# Patient Record
Sex: Female | Born: 2005 | Hispanic: No | Marital: Single | Smoking: Never smoker
Health system: Southern US, Community
[De-identification: ages and names within clinical notes are randomized; demographics above are authoritative.]

---
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
## 2024-01-09 ENCOUNTER — Encounter: Payer: Self-pay | Admitting: *Deleted

## 2024-02-18 DIAGNOSIS — N760 Acute vaginitis: Secondary | ICD-10-CM | POA: Diagnosis not present

## 2024-02-18 DIAGNOSIS — B3731 Acute candidiasis of vulva and vagina: Secondary | ICD-10-CM | POA: Diagnosis not present

## 2024-02-18 DIAGNOSIS — Z113 Encounter for screening for infections with a predominantly sexual mode of transmission: Secondary | ICD-10-CM | POA: Diagnosis not present

## 2024-02-26 DIAGNOSIS — G4700 Insomnia, unspecified: Secondary | ICD-10-CM | POA: Diagnosis not present

## 2024-02-26 DIAGNOSIS — F4321 Adjustment disorder with depressed mood: Secondary | ICD-10-CM | POA: Diagnosis not present

## 2024-02-26 DIAGNOSIS — Z719 Counseling, unspecified: Secondary | ICD-10-CM | POA: Diagnosis not present
